# Patient Record
Sex: Female | Born: 2005 | Race: Black or African American | Hispanic: No | Marital: Single | Smoking: Never smoker
Health system: Southern US, Community
[De-identification: ages and names within clinical notes are randomized; demographics above are authoritative.]

## PROBLEM LIST (undated history)

## (undated) DIAGNOSIS — I10 Essential (primary) hypertension: Secondary | ICD-10-CM

## (undated) DIAGNOSIS — W3400XA Accidental discharge from unspecified firearms or gun, initial encounter: Secondary | ICD-10-CM

## (undated) DIAGNOSIS — O139 Gestational [pregnancy-induced] hypertension without significant proteinuria, unspecified trimester: Secondary | ICD-10-CM

## (undated) DIAGNOSIS — D649 Anemia, unspecified: Secondary | ICD-10-CM

---
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
## 2024-01-16 ENCOUNTER — Encounter (HOSPITAL_COMMUNITY): Payer: Self-pay | Admitting: *Deleted

## 2024-01-16 ENCOUNTER — Inpatient Hospital Stay (HOSPITAL_COMMUNITY)
Admission: AD | Admit: 2024-01-16 | Discharge: 2024-01-16 | Disposition: A | Discharge status: Home / Self Care | Attending: Obstetrics and Gynecology | Admitting: Obstetrics and Gynecology

## 2024-01-16 DIAGNOSIS — N390 Urinary tract infection, site not specified: Secondary | ICD-10-CM

## 2024-01-16 DIAGNOSIS — Z3A08 8 weeks gestation of pregnancy: Secondary | ICD-10-CM | POA: Diagnosis not present

## 2024-01-16 DIAGNOSIS — O2341 Unspecified infection of urinary tract in pregnancy, first trimester: Secondary | ICD-10-CM | POA: Insufficient documentation

## 2024-01-16 DIAGNOSIS — Z0379 Encounter for other suspected maternal and fetal conditions ruled out: Secondary | ICD-10-CM | POA: Diagnosis present

## 2024-01-16 HISTORY — DX: Essential (primary) hypertension: I10

## 2024-01-16 HISTORY — DX: Gestational (pregnancy-induced) hypertension without significant proteinuria, unspecified trimester: O13.9

## 2024-01-16 LAB — URINALYSIS, ROUTINE W REFLEX MICROSCOPIC
Bilirubin Urine: NEGATIVE
Glucose, UA: NEGATIVE mg/dL
Hgb urine dipstick: NEGATIVE
Ketones, ur: NEGATIVE mg/dL
Leukocytes,Ua: NEGATIVE
Nitrite: POSITIVE — AB
Protein, ur: NEGATIVE mg/dL
Specific Gravity, Urine: 1.02 (ref 1.005–1.030)
pH: 6 (ref 5.0–8.0)

## 2024-01-16 MED ORDER — CEFADROXIL 500 MG PO CAPS: 500.0000 mg | ORAL_CAPSULE | Freq: Two times a day (BID) | ORAL | 0 refills | Status: AC
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
## 2024-01-16 NOTE — MAU Note (Signed)
 Joy [name] is a 18 y.o. at [redacted]w[redacted]d here in MAU reporting: was at [facility]  a while back, was to have come here immediately after, but she had to go to work.  Was dx as early preg.  Possible groin hernia and a UTI.  Was told we would have to treat her and give   her meds.  Feeling normal now. Denies urinary symptoms. Pain was was in RLQ up to the line of umbilicus(this was they though the hernia. No longer having that pain, hasn't had it in a while. No bleeding.  When she feels nauseated, she just eats something and it goes away. Right now is doing pretty good.   Onset of complaint: no c/o- was told to f/u here Pain score: none Vitals:   01/16/24 1440  BP: 117/62  Pulse: 86  Resp: 16  Temp: 99.1 F (37.3 C)  SpO2: 100%      Lab orders placed from triage:

## 2024-01-18 LAB — CULTURE, OB URINE: Culture: >=100000 — AB

## 2024-01-25 ENCOUNTER — Other Ambulatory Visit: Payer: Self-pay

## 2024-01-25 ENCOUNTER — Inpatient Hospital Stay (HOSPITAL_COMMUNITY): Admitting: Registered Nurse

## 2024-01-25 ENCOUNTER — Inpatient Hospital Stay (HOSPITAL_COMMUNITY)

## 2024-01-25 ENCOUNTER — Inpatient Hospital Stay (HOSPITAL_COMMUNITY)
Admission: AD | Admit: 2024-01-25 | Discharge: 2024-01-25 | Disposition: A | Discharge status: Home / Self Care | Payer: Self-pay | Attending: Obstetrics and Gynecology | Admitting: Obstetrics and Gynecology

## 2024-01-25 ENCOUNTER — Encounter (HOSPITAL_COMMUNITY)
Admission: AD | Disposition: A | Discharge status: Home / Self Care | Payer: Self-pay | Source: Home / Self Care | Attending: Obstetrics and Gynecology

## 2024-01-25 ENCOUNTER — Encounter (HOSPITAL_COMMUNITY): Payer: Self-pay | Admitting: Obstetrics and Gynecology

## 2024-01-25 DIAGNOSIS — Z3A08 8 weeks gestation of pregnancy: Secondary | ICD-10-CM

## 2024-01-25 DIAGNOSIS — Z87891 Personal history of nicotine dependence: Secondary | ICD-10-CM | POA: Insufficient documentation

## 2024-01-25 DIAGNOSIS — I10 Essential (primary) hypertension: Secondary | ICD-10-CM

## 2024-01-25 DIAGNOSIS — D62 Acute posthemorrhagic anemia: Secondary | ICD-10-CM | POA: Insufficient documentation

## 2024-01-25 DIAGNOSIS — O074 Failed attempted termination of pregnancy without complication: Secondary | ICD-10-CM | POA: Diagnosis present

## 2024-01-25 DIAGNOSIS — N3001 Acute cystitis with hematuria: Secondary | ICD-10-CM | POA: Diagnosis not present

## 2024-01-25 DIAGNOSIS — O034 Incomplete spontaneous abortion without complication: Secondary | ICD-10-CM

## 2024-01-25 DIAGNOSIS — D500 Iron deficiency anemia secondary to blood loss (chronic): Secondary | ICD-10-CM

## 2024-01-25 HISTORY — PX: DILATION AND EVACUATION: SHX1459

## 2024-01-25 LAB — URINALYSIS, ROUTINE W REFLEX MICROSCOPIC
Bilirubin Urine: NEGATIVE
Glucose, UA: NEGATIVE mg/dL
Ketones, ur: NEGATIVE mg/dL
Nitrite: POSITIVE — AB
Protein, ur: 30 mg/dL — AB
RBC / HPF: >50 RBC/hpf (ref 0–5)
Specific Gravity, Urine: 1.024 (ref 1.005–1.030)
WBC, UA: >50 WBC/hpf (ref 0–5)
pH: 5 (ref 5.0–8.0)

## 2024-01-25 LAB — TYPE AND SCREEN
ABO/RH(D): A POS
Antibody Screen: NEGATIVE

## 2024-01-25 LAB — CBC
HCT: 29.6 % — ABNORMAL LOW (ref 36.0–46.0)
Hemoglobin: 9.9 g/dL — ABNORMAL LOW (ref 12.0–15.0)
MCH: 27.2 pg (ref 26.0–34.0)
MCHC: 33.4 g/dL (ref 30.0–36.0)
MCV: 81.3 fL (ref 80.0–100.0)
Platelets: 245 10*3/uL (ref 150–400)
RBC: 3.64 MIL/uL — ABNORMAL LOW (ref 3.87–5.11)
RDW: 13.2 % (ref 11.5–15.5)
WBC: 12.3 10*3/uL — ABNORMAL HIGH (ref 4.0–10.5)
nRBC: 0 % (ref 0.0–0.2)

## 2024-01-25 SURGERY — DILATION AND EVACUATION, UTERUS
Anesthesia: Monitor Anesthesia Care

## 2024-01-25 MED ORDER — PROPOFOL 10 MG/ML IV BOLUS
INTRAVENOUS | Status: AC
  Filled 2024-01-25: qty 20

## 2024-01-25 MED ORDER — ACETAMINOPHEN 10 MG/ML IV SOLN
1000.0000 mg | Freq: Once | INTRAVENOUS | Status: DC | PRN
Start: 2024-01-25 — End: 2024-01-25

## 2024-01-25 MED ORDER — FENTANYL CITRATE (PF) 250 MCG/5ML IJ SOLN
INTRAMUSCULAR | Status: AC
  Filled 2024-01-25: qty 5

## 2024-01-25 MED ORDER — CHLORHEXIDINE GLUCONATE 0.12 % MT SOLN: 15.0000 mL | Freq: Once | OROMUCOSAL | Status: AC

## 2024-01-25 MED ORDER — CARBOPROST TROMETHAMINE 250 MCG/ML IM SOLN
INTRAMUSCULAR | Status: AC
  Filled 2024-01-25: qty 1

## 2024-01-25 MED ORDER — MIDAZOLAM HCL 2 MG/2ML IJ SOLN
INTRAMUSCULAR | Status: AC
  Filled 2024-01-25: qty 2

## 2024-01-25 MED ORDER — POVIDONE-IODINE 10 % EX SWAB: 2.0000 | Freq: Once | CUTANEOUS | Status: DC

## 2024-01-25 MED ORDER — ORAL CARE MOUTH RINSE: 15.0000 mL | Freq: Once | OROMUCOSAL | Status: AC

## 2024-01-25 MED ORDER — SULFAMETHOXAZOLE-TRIMETHOPRIM 800-160 MG PO TABS: 1.0000 | ORAL_TABLET | Freq: Two times a day (BID) | ORAL | 0 refills | Status: AC

## 2024-01-25 MED ORDER — MIDAZOLAM HCL 2 MG/2ML IJ SOLN
INTRAMUSCULAR | Status: DC | PRN
  Administered 2024-01-25 (×2): 2 mg via INTRAVENOUS

## 2024-01-25 MED ORDER — LACTATED RINGERS IV SOLN: INTRAVENOUS | Status: DC

## 2024-01-25 MED ORDER — FENTANYL CITRATE (PF) 100 MCG/2ML IJ SOLN: 25.0000 ug | INTRAMUSCULAR | Status: DC | PRN

## 2024-01-25 MED ORDER — CHLORHEXIDINE GLUCONATE 0.12 % MT SOLN
OROMUCOSAL | Status: AC
  Administered 2024-01-25: 15 mL via OROMUCOSAL
  Filled 2024-01-25: qty 15

## 2024-01-25 MED ORDER — CHLOROPROCAINE HCL 1 % IJ SOLN
INTRAMUSCULAR | Status: AC
  Filled 2024-01-25: qty 30

## 2024-01-25 MED ORDER — LIDOCAINE HCL (PF) 1 % IJ SOLN
INTRAMUSCULAR | Status: AC
  Filled 2024-01-25: qty 30

## 2024-01-25 MED ORDER — ACETAMINOPHEN-CAFFEINE 500-65 MG PO TABS
2.0000 | ORAL_TABLET | Freq: Once | ORAL | Status: AC
  Administered 2024-01-25: 2 via ORAL
  Filled 2024-01-25: qty 2

## 2024-01-25 MED ORDER — FENTANYL CITRATE (PF) 250 MCG/5ML IJ SOLN
INTRAMUSCULAR | Status: DC | PRN
  Administered 2024-01-25: 50 ug via INTRAVENOUS
  Administered 2024-01-25: 25 ug via INTRAVENOUS
  Administered 2024-01-25: 50 ug via INTRAVENOUS

## 2024-01-25 MED ORDER — DOCUSATE SODIUM 100 MG PO CAPS: 100.0000 mg | ORAL_CAPSULE | Freq: Two times a day (BID) | ORAL | 0 refills | Status: AC

## 2024-01-25 MED ORDER — METHYLERGONOVINE MALEATE 0.2 MG/ML IJ SOLN
INTRAMUSCULAR | Status: AC
  Filled 2024-01-25: qty 1

## 2024-01-25 MED ORDER — ONDANSETRON HCL 4 MG/2ML IJ SOLN
INTRAMUSCULAR | Status: DC | PRN
  Administered 2024-01-25: 4 mg via INTRAVENOUS

## 2024-01-25 MED ORDER — DOXYCYCLINE HYCLATE 100 MG IV SOLR
200.0000 mg | Freq: Once | INTRAVENOUS | Status: AC
  Administered 2024-01-25: 200 mg via INTRAVENOUS
  Filled 2024-01-25: qty 200

## 2024-01-25 MED ORDER — OXYCODONE HCL 5 MG/5ML PO SOLN: 5.0000 mg | Freq: Once | ORAL | Status: DC | PRN

## 2024-01-25 MED ORDER — OXYCODONE HCL 5 MG PO TABS: 5.0000 mg | ORAL_TABLET | Freq: Once | ORAL | Status: DC | PRN

## 2024-01-25 MED ORDER — DEXAMETHASONE SODIUM PHOSPHATE 10 MG/ML IJ SOLN
INTRAMUSCULAR | Status: DC | PRN
  Administered 2024-01-25: 10 mg via INTRAVENOUS

## 2024-01-25 MED ORDER — KETOROLAC TROMETHAMINE 15 MG/ML IJ SOLN
INTRAMUSCULAR | Status: DC | PRN
Start: 2024-01-25 — End: 2024-01-25
  Administered 2024-01-25: 15 mg via INTRAVENOUS

## 2024-01-25 MED ORDER — PROPOFOL 500 MG/50ML IV EMUL
INTRAVENOUS | Status: DC | PRN
  Administered 2024-01-25: 150 ug/kg/min via INTRAVENOUS

## 2024-01-25 MED ORDER — OXYTOCIN-SODIUM CHLORIDE 30-0.9 UT/500ML-% IV SOLN
INTRAVENOUS | Status: AC
  Filled 2024-01-25: qty 500

## 2024-01-25 MED ORDER — MIDAZOLAM HCL 2 MG/2ML IJ SOLN
INTRAMUSCULAR | Status: AC
Start: 2024-01-25 — End: 2024-01-25
  Filled 2024-01-25: qty 2

## 2024-01-25 MED ORDER — MISOPROSTOL 200 MCG PO TABS
ORAL_TABLET | ORAL | Status: AC
  Filled 2024-01-25: qty 5

## 2024-01-25 MED ORDER — IBUPROFEN 600 MG PO TABS: 600.0000 mg | ORAL_TABLET | Freq: Four times a day (QID) | ORAL | 3 refills | Status: AC | PRN

## 2024-01-25 MED ORDER — PROPOFOL 10 MG/ML IV BOLUS
INTRAVENOUS | Status: DC | PRN
  Administered 2024-01-25 (×2): 50 mg via INTRAVENOUS

## 2024-01-25 SURGICAL SUPPLY — 22 items
CATH ROBINSON RED A/P 16FR (CATHETERS) ×1 IMPLANT
COVER MAYO STAND STRL (DRAPES) ×1 IMPLANT
FILTER UTR ASPR ASSEMBLY (MISCELLANEOUS) ×1 IMPLANT
GLOVE BIOGEL PI IND STRL 6.5 (GLOVE) IMPLANT
GLOVE BIOGEL PI IND STRL 7.0 (GLOVE) IMPLANT
GLOVE SURG SS PI 6.5 STRL IVOR (GLOVE) ×1 IMPLANT
GOWN STRL REUS W/ TWL LRG LVL3 (GOWN DISPOSABLE) ×2 IMPLANT
HOSE CONNECTING 18IN BERKELEY (TUBING) ×1 IMPLANT
KIT BERKELEY 1ST TRI 3/8 NO TR (MISCELLANEOUS) ×1 IMPLANT
KIT BERKELEY 1ST TRIMESTER 3/8 (MISCELLANEOUS) ×1 IMPLANT
NS IRRIG 1000ML POUR BTL (IV SOLUTION) ×1 IMPLANT
PACK VAGINAL MINOR WOMEN LF (CUSTOM PROCEDURE TRAY) ×1 IMPLANT
PAD OB MATERNITY 11 LF (PERSONAL CARE ITEMS) ×1 IMPLANT
SET BERKELEY SUCTION TUBING (SUCTIONS) ×1 IMPLANT
SPIKE FLUID TRANSFER (MISCELLANEOUS) ×1 IMPLANT
TOWEL GREEN STERILE FF (TOWEL DISPOSABLE) ×2 IMPLANT
UNDERPAD 30X36 HEAVY ABSORB (UNDERPADS AND DIAPERS) ×1 IMPLANT
VACURETTE 10 RIGID CVD (CANNULA) IMPLANT
VACURETTE 6 ASPIR F TIP BERK (CANNULA) IMPLANT
VACURETTE 7MM CVD STRL WRAP (CANNULA) IMPLANT
VACURETTE 8 RIGID CVD (CANNULA) IMPLANT
VACURETTE 9 RIGID CVD (CANNULA) IMPLANT

## 2024-01-25 NOTE — Anesthesia Preprocedure Evaluation (Addendum)
 Anesthesia Evaluation  Patient identified by MRN, date of birth, ID band Patient awake    Reviewed: Allergy & Precautions, NPO status , Patient's Chart, lab work & pertinent test results  History of Anesthesia Complications Negative for: history of anesthetic complications  Airway Mallampati: II  TM Distance: >3 FB Neck ROM: Full    Dental  (+) Dental Advisory Given   Pulmonary Patient abstained from smoking.   breath sounds clear to auscultation       Cardiovascular  Rhythm:Regular     Neuro/Psych    GI/Hepatic   Endo/Other    Renal/GU      Musculoskeletal   Abdominal   Peds  Hematology  (+) Blood dyscrasia, anemia Lab Results      Component                Value               Date                      WBC                      12.3 (H)            01/25/2024                HGB                      9.9 (L)             01/25/2024                HCT                      29.6 (L)            01/25/2024                MCV                      81.3                01/25/2024                PLT                      245                 01/25/2024              Anesthesia Other Findings   Reproductive/Obstetrics  incomplete miscarriage                              Anesthesia Physical Anesthesia Plan  ASA: 2  Anesthesia Plan: MAC   Post-op Pain Management: Tylenol  PO (pre-op)*   Induction: Intravenous  PONV Risk Score and Plan: 2 and Propofol  infusion, Midazolam , Treatment may vary due to age or medical condition and Ondansetron   Airway Management Planned: Nasal Cannula, Natural Airway and Simple Face Mask  Additional Equipment: None  Intra-op Plan:   Post-operative Plan:   Informed Consent: I have reviewed the patients History and Physical, chart, labs and discussed the procedure including the risks, benefits and alternatives for the proposed anesthesia with the patient or authorized  representative who has indicated his/her understanding and acceptance.     Dental advisory given  Plan  Discussed with: CRNA  Anesthesia Plan Comments:         Anesthesia Quick Evaluation

## 2024-01-25 NOTE — Transfer of Care (Signed)
 Immediate Anesthesia Transfer of Care Note  Patient: Joy [name]  Procedure(s) Performed: DILATION AND EVACUATION, UTERUS  Patient Location: PACU  Anesthesia Type:MAC  Level of Consciousness: drowsy and responds to stimulation  Airway & Oxygen Therapy: Patient Spontanous Breathing  Post-op Assessment: Report given to RN and Post -op Vital signs reviewed and stable  Post vital signs: Reviewed and stable  Last Vitals:  Vitals Value Taken Time  BP 103/59 01/25/24 09:24  Temp    Pulse 60 01/25/24 09:27  Resp 22 01/25/24 09:27  SpO2 100 % 01/25/24 09:27  Vitals shown include unfiled device data.  Last Pain:  Vitals:   01/25/24 0824  TempSrc:   PainSc: 0-No pain         Complications: No notable events documented.

## 2024-01-25 NOTE — Progress Notes (Signed)
 Signout received from Dr. Izell. Patient seen and evaluated. Plan of care reviewed. Reviewed plan for suction dilation and curettage for retained products of conception.  The risks, benefits and alternatives of the procedure were reviewed in detail. The risks of surgery were discussed with the patient including but were not limited to:    -- Bleeding with associated risks of blood transfusion  or rare risk of hysterectomy -- Infection which may require antibiotics -- Injury to bowel, bladder, uterus, tubes, ovaries or other surrounding organs  --Thromboembolic phenomenon such as MI, CVA or death and other postoperative/anesthesia complications.     The patient concurred with the proposed plan, giving informed written consent for the procedure.     Rollo ONEIDA Bring, MD, FACOG Obstetrician & Gynecologist, [facility] for [facility], [facility]

## 2024-01-25 NOTE — MAU Note (Addendum)
 Joy [name] is a 18 y.o. at [redacted]w[redacted]d here in MAU reporting: took abortion pill on Thursday and has been feeling weak, along with having headaches ever since. States whole waist area gets numb and locks up - took 800mg  of ibuprofen  before coming here so she is not having this pain currently. Still having heavy VB - saturates 1 pad within 2 hours.   LMP: NA Onset of complaint: thursday Pain score: 8 - HA Vitals:   01/25/24 0338  BP: 117/60  Pulse: 93  Resp: 19  Temp: 99.9 F (37.7 C)  SpO2: 100%     FHT: NA  Lab orders placed from triage: UA

## 2024-01-25 NOTE — MAU Provider Note (Signed)
 GYN Note Patient states she was about [redacted] weeks along and recently took medication abortion pills that she ordered online; no pre-abortion labwork done. I told her that she has a good amount of retained products of conception with vascularity and she is anemic (asymptomatic). Given this, I do not recommend another dose of the medication and recommend suction d&c. R/B d/w her and she is amenable to proceeding. I told her that she should be able to go home after the surgery and she'll need someone to take her home and watch her the rest of the day.  OR called and availability only at 0830 today. Patient has been NPO since 2330 (regular dinner)  Bebe Izell Raddle MD Attending Center for [employer] (Faculty Practice) 01/25/2024 Time: [phone]

## 2024-01-25 NOTE — Op Note (Signed)
 Joy [name] PROCEDURE DATE: 01/25/2024  PREOPERATIVE DIAGNOSIS: Retained products of conception  POSTOPERATIVE DIAGNOSIS: Same  PROCEDURE: Suction dilation and curettage under ultrasound guidance  SURGEON:  Dr. Rollo T. Gerrald Basu, MD  INDICATIONS: [name] with retained products of conception after medication abortion last week.    FINDINGS:  Normal size uterus, moderate amounts of products of conception, specimen sent to pathology.  ANESTHESIA: MAC, paracervical block with 20 ml of 1% lidocaine   INTRAVENOUS FLUIDS:  250 ml   ESTIMATED BLOOD LOSS:  5 ml  SPECIMENS:  Products of conception sent to pathology   COMPLICATIONS:  None immediate.  PROCEDURE DETAILS:  The patient received intravenous Doxycycline while in the preoperative area.  She was then taken to the operating room MAC was administered and was found to be adequate.  After an adequate timeout was performed, she was placed in the dorsal lithotomy position; then prepped and draped in the sterile manner.   A vaginal speculum was then placed in the patient's vagina and a single tooth tenaculum was applied to the anterior lip of the cervix.  A paracervical block using 20 ml of 1% lidocaine . The cervix was gently dilated to accommodate a 7 mm suction curette that was gently advanced to the uterine fundus under ultrasound guidance. The suction device was then activated and curette slowly rotated to clear the uterus of products of conception.  Suction curettage was done until complete emptying of the uterus was confirmed. There was minimal bleeding noted and the tenaculum removed with good hemostasis noted.   All instruments were removed from the patient's vagina. Her bladder was drained. Sponge and instrument counts were correct times two.  The patient tolerated the procedure well and was taken to the recovery area extubated, awake, and in stable condition.  The patient will be discharged to home as per PACU criteria.  Routine  postoperative instructions given.  She will follow up in the office in 2-3 weeks for postoperative evaluation  Rollo ONEIDA Bring, MD, FACOG Obstetrician & Gynecologist, [facility] for [facility], [facility]
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
## 2024-01-26 ENCOUNTER — Encounter (HOSPITAL_COMMUNITY): Payer: Self-pay | Admitting: Obstetrics and Gynecology

## 2024-01-26 ENCOUNTER — Ambulatory Visit: Payer: Self-pay | Admitting: Obstetrics and Gynecology

## 2024-01-26 LAB — SURGICAL PATHOLOGY

## 2024-01-27 LAB — CULTURE, OB URINE: Culture: >=100000 — AB

## 2024-01-29 NOTE — Anesthesia Postprocedure Evaluation (Signed)
 Anesthesia Post Note  Patient: Joy [name]  Procedure(s) Performed: DILATION AND EVACUATION, UTERUS     Patient location during evaluation: PACU Anesthesia Type: MAC Level of consciousness: awake and alert Pain management: pain level controlled Vital Signs Assessment: post-procedure vital signs reviewed and stable Respiratory status: spontaneous breathing, nonlabored ventilation and respiratory function stable Cardiovascular status: stable and blood pressure returned to baseline Postop Assessment: no apparent nausea or vomiting Anesthetic complications: no   No notable events documented.             Kahlani Graber
